# Patient Record
Sex: Male | Born: 1955 | Race: White | Hispanic: No | Smoking: Never smoker
Health system: Southern US, Community
[De-identification: ages and names within clinical notes are randomized; demographics above are authoritative.]

---

## 2021-04-13 ENCOUNTER — Emergency Department (HOSPITAL_BASED_OUTPATIENT_CLINIC_OR_DEPARTMENT_OTHER): Payer: BC Managed Care – PPO

## 2021-04-13 ENCOUNTER — Other Ambulatory Visit: Payer: Self-pay

## 2021-04-13 ENCOUNTER — Other Ambulatory Visit (HOSPITAL_BASED_OUTPATIENT_CLINIC_OR_DEPARTMENT_OTHER): Payer: Self-pay

## 2021-04-13 ENCOUNTER — Encounter (HOSPITAL_BASED_OUTPATIENT_CLINIC_OR_DEPARTMENT_OTHER): Payer: Self-pay

## 2021-04-13 ENCOUNTER — Emergency Department (HOSPITAL_BASED_OUTPATIENT_CLINIC_OR_DEPARTMENT_OTHER)
Admission: EM | Admit: 2021-04-13 | Discharge: 2021-04-13 | Disposition: A | Discharge status: Home / Self Care | Payer: BC Managed Care – PPO | Attending: Emergency Medicine | Admitting: Emergency Medicine

## 2021-04-13 DIAGNOSIS — E119 Type 2 diabetes mellitus without complications: Secondary | ICD-10-CM | POA: Diagnosis not present

## 2021-04-13 DIAGNOSIS — R350 Frequency of micturition: Secondary | ICD-10-CM | POA: Insufficient documentation

## 2021-04-13 DIAGNOSIS — R197 Diarrhea, unspecified: Secondary | ICD-10-CM | POA: Diagnosis not present

## 2021-04-13 DIAGNOSIS — Z20822 Contact with and (suspected) exposure to covid-19: Secondary | ICD-10-CM | POA: Insufficient documentation

## 2021-04-13 DIAGNOSIS — J189 Pneumonia, unspecified organism: Secondary | ICD-10-CM | POA: Diagnosis not present

## 2021-04-13 DIAGNOSIS — R0789 Other chest pain: Secondary | ICD-10-CM | POA: Diagnosis present

## 2021-04-13 LAB — CBC WITH DIFFERENTIAL/PLATELET
Abs Immature Granulocytes: 0.06 10*3/uL (ref 0.00–0.07)
Basophils Absolute: 0.1 10*3/uL (ref 0.0–0.1)
Basophils Relative: 0 %
Eosinophils Absolute: 0 10*3/uL (ref 0.0–0.5)
Eosinophils Relative: 0 %
HCT: 47.1 % (ref 39.0–52.0)
Hemoglobin: 15.8 g/dL (ref 13.0–17.0)
Immature Granulocytes: 0 %
Lymphocytes Relative: 15 %
Lymphs Abs: 2 10*3/uL (ref 0.7–4.0)
MCH: 30.3 pg (ref 26.0–34.0)
MCHC: 33.5 g/dL (ref 30.0–36.0)
MCV: 90.4 fL (ref 80.0–100.0)
Monocytes Absolute: 1.1 10*3/uL — ABNORMAL HIGH (ref 0.1–1.0)
Monocytes Relative: 9 %
Neutro Abs: 10.2 10*3/uL — ABNORMAL HIGH (ref 1.7–7.7)
Neutrophils Relative %: 76 %
Platelets: 233 10*3/uL (ref 150–400)
RBC: 5.21 MIL/uL (ref 4.22–5.81)
RDW: 13.8 % (ref 11.5–15.5)
WBC: 13.5 10*3/uL — ABNORMAL HIGH (ref 4.0–10.5)
nRBC: 0 % (ref 0.0–0.2)

## 2021-04-13 LAB — I-STAT VENOUS BLOOD GAS, ED
Acid-Base Excess: 4 mmol/L — ABNORMAL HIGH (ref 0.0–2.0)
Bicarbonate: 28.4 mmol/L — ABNORMAL HIGH (ref 20.0–28.0)
Calcium, Ion: 1.1 mmol/L — ABNORMAL LOW (ref 1.15–1.40)
HCT: 43 % (ref 39.0–52.0)
Hemoglobin: 14.6 g/dL (ref 13.0–17.0)
O2 Saturation: 47 %
Potassium: 4.8 mmol/L (ref 3.5–5.1)
Sodium: 136 mmol/L (ref 135–145)
TCO2: 30 mmol/L (ref 22–32)
pCO2, Ven: 40.3 mmHg — ABNORMAL LOW (ref 44.0–60.0)
pH, Ven: 7.455 — ABNORMAL HIGH (ref 7.250–7.430)
pO2, Ven: 25 mmHg — CL (ref 32.0–45.0)

## 2021-04-13 LAB — COMPREHENSIVE METABOLIC PANEL
ALT: 16 U/L (ref 0–44)
AST: 15 U/L (ref 15–41)
Albumin: 4.4 g/dL (ref 3.5–5.0)
Alkaline Phosphatase: 53 U/L (ref 38–126)
Anion gap: 12 (ref 5–15)
BUN: 13 mg/dL (ref 8–23)
CO2: 24 mmol/L (ref 22–32)
Calcium: 9.5 mg/dL (ref 8.9–10.3)
Chloride: 100 mmol/L (ref 98–111)
Creatinine, Ser: 0.85 mg/dL (ref 0.61–1.24)
GFR, Estimated: >60 mL/min (ref >60)
Glucose, Bld: 116 mg/dL — ABNORMAL HIGH (ref 70–99)
Potassium: 3.7 mmol/L (ref 3.5–5.1)
Sodium: 136 mmol/L (ref 135–145)
Total Bilirubin: 1.5 mg/dL — ABNORMAL HIGH (ref 0.3–1.2)
Total Protein: 7.4 g/dL (ref 6.5–8.1)

## 2021-04-13 LAB — URINALYSIS, ROUTINE W REFLEX MICROSCOPIC
Bilirubin Urine: NEGATIVE
Glucose, UA: >1000 mg/dL — AB
Hgb urine dipstick: NEGATIVE
Leukocytes,Ua: NEGATIVE
Nitrite: NEGATIVE
Protein, ur: NEGATIVE mg/dL
Specific Gravity, Urine: 1.038 — ABNORMAL HIGH (ref 1.005–1.030)
pH: 5 (ref 5.0–8.0)

## 2021-04-13 LAB — RESP PANEL BY RT-PCR (FLU A&B, COVID) ARPGX2
Influenza A by PCR: NEGATIVE
Influenza B by PCR: NEGATIVE
SARS Coronavirus 2 by RT PCR: NEGATIVE

## 2021-04-13 LAB — CBG MONITORING, ED: Glucose-Capillary: 126 mg/dL — ABNORMAL HIGH (ref 70–99)

## 2021-04-13 MED ORDER — IOHEXOL 300 MG/ML  SOLN
75.0000 mL | Freq: Once | INTRAMUSCULAR | Status: AC | PRN
  Administered 2021-04-13: 75 mL via INTRAVENOUS

## 2021-04-13 MED ORDER — AZITHROMYCIN 250 MG PO TABS
250.0000 mg | ORAL_TABLET | Freq: Every day | ORAL | 0 refills | Status: AC
  Filled 2021-04-13: qty 6, 5d supply, fill #0

## 2021-04-13 MED ORDER — AMOXICILLIN-POT CLAVULANATE 875-125 MG PO TABS
1.0000 | ORAL_TABLET | Freq: Two times a day (BID) | ORAL | 0 refills | Status: AC
  Filled 2021-04-13: qty 14, 7d supply, fill #0

## 2021-04-13 NOTE — ED Provider Notes (Signed)
[facility]   CSN: [id number] Arrival date & time: 04/13/21  [id number]     History Chief Complaint  Patient presents with   Fatigue   Urinary Frequency    Tyler CoveRoger Parker is a 65 y.o. male.  He has a history of diabetes.  He is complaining of a few days of feeling generally weak, and urinary frequency.  No dysuria or hematuria.  No chest pain or abdominal pain.  Has a little bit of a cough and had some loose stools.  No fevers or chills.  No sick contacts or recent travel.  He says he feels similar to the beginning of symptoms that led to him being in DKA a few years ago.  The history is provided by the patient.  Urinary Frequency This is a new problem. The current episode started more than 2 days ago. The problem occurs hourly. The problem has not changed since onset.Pertinent negatives include no chest pain, no abdominal pain, no headaches and no shortness of breath. Nothing aggravates the symptoms. Nothing relieves the symptoms. He has tried nothing for the symptoms. The treatment provided no relief.      History reviewed. No pertinent past medical history.  There are no problems to display for this patient.   History reviewed. No pertinent surgical history.     No family history on file.  Social History   Tobacco Use   Smoking status: Never   Smokeless tobacco: Never  Substance Use Topics   Alcohol use: Yes    Comment: Occasionally   Drug use: Never    Home Medications Prior to Admission medications   Not on File    Allergies    Patient has no allergy information on record.  Review of Systems   Review of Systems  Constitutional:  Positive for fatigue. Negative for fever.  HENT:  Negative for sore throat.   Eyes:  Negative for visual disturbance.  Respiratory:  Positive for cough. Negative for shortness of breath.   Cardiovascular:  Negative for chest pain.  Gastrointestinal:  Positive for diarrhea. Negative for  abdominal pain.  Genitourinary:  Positive for frequency. Negative for dysuria.  Musculoskeletal:  Negative for gait problem.  Skin:  Negative for rash.  Neurological:  Negative for headaches.   Physical Exam Updated Vital Signs BP (!) 141/77 (BP Location: Right Arm)   Pulse 84   Temp 98.4 F (36.9 C) (Oral)   Resp 16   Ht 6\' 3"  (1.905 m)   Wt 102.1 kg   SpO2 100%   BMI 28.12 kg/m   Physical Exam Vitals and nursing note reviewed.  Constitutional:      Appearance: Normal appearance. He is well-developed.  HENT:     Head: Normocephalic and atraumatic.  Eyes:     Conjunctiva/sclera: Conjunctivae normal.  Cardiovascular:     Rate and Rhythm: Normal rate and regular rhythm.     Heart sounds: No murmur heard. Pulmonary:     Effort: Pulmonary effort is normal. No respiratory distress.     Breath sounds: Normal breath sounds.  Abdominal:     Palpations: Abdomen is soft.     Tenderness: There is no abdominal tenderness. There is no guarding or rebound.  Musculoskeletal:        General: No deformity or signs of injury. Normal range of motion.     Cervical back: Neck supple.  Skin:    General: Skin is warm and dry.  Neurological:     General:  No focal deficit present.     Mental Status: He is alert.     Sensory: No sensory deficit.     Motor: No weakness.     Gait: Gait normal.    ED Results / Procedures / Treatments   Labs (all labs ordered are listed, but only abnormal results are displayed) Labs Reviewed  COMPREHENSIVE METABOLIC PANEL - Abnormal; Notable for the following components:      Result Value   Glucose, Bld 116 (*)    Total Bilirubin 1.5 (*)    All other components within normal limits  CBC WITH DIFFERENTIAL/PLATELET - Abnormal; Notable for the following components:   WBC 13.5 (*)    Neutro Abs 10.2 (*)    Monocytes Absolute 1.1 (*)    All other components within normal limits  URINALYSIS, ROUTINE W REFLEX MICROSCOPIC - Abnormal; Notable for the  following components:   Specific Gravity, Urine 1.038 (*)    Glucose, UA >1,000 (*)    Ketones, ur TRACE (*)    All other components within normal limits  CBG MONITORING, ED - Abnormal; Notable for the following components:   Glucose-Capillary 126 (*)    All other components within normal limits  I-STAT VENOUS BLOOD GAS, ED - Abnormal; Notable for the following components:   pH, Ven 7.455 (*)    pCO2, Ven 40.3 (*)    pO2, Ven 25.0 (*)    Bicarbonate 28.4 (*)    Acid-Base Excess 4.0 (*)    Calcium, Ion 1.10 (*)    All other components within normal limits  RESP PANEL BY RT-PCR (FLU A&B, COVID) [id number]  URINE CULTURE    EKG EKG Interpretation  Date/Time:  Wednesday April 13 2021 07:14:14 EDT Ventricular Rate:  79 PR Interval:  174 QRS Duration: 98 QT Interval:  368 QTC Calculation: 422 R Axis:   72 Text Interpretation: Sinus rhythm No old tracing to compare Confirmed by Meridee Score [phone] on 04/13/2021 7:17:57 AM  Radiology CT Chest W Contrast  Result Date: 04/13/2021 CLINICAL DATA:  Follow-up chest x-ray EXAM: CT CHEST WITH CONTRAST TECHNIQUE: Multidetector CT imaging of the chest was performed during intravenous contrast administration. CONTRAST:  28mL OMNIPAQUE IOHEXOL 300 MG/ML  SOLN COMPARISON:  Chest x-ray earlier today FINDINGS: Cardiovascular: Heart is normal size. Aorta is normal caliber. Scattered moderate coronary artery calcifications most pronounced in the left anterior descending coronary artery and right coronary artery. Scattered aortic calcifications. Mediastinum/Nodes: No mediastinal, hilar, or axillary adenopathy. Trachea and esophagus are unremarkable. Thyroid unremarkable. Lungs/Pleura: Airspace disease in the lingula corresponds to the density seen on chest x-ray and most likely reflects pneumonia. Remainder the lungs are clear. No effusions. Upper Abdomen: Imaging into the upper abdomen demonstrates no acute findings. Musculoskeletal: Chest wall soft tissues  are unremarkable. No acute bony abnormality. IMPRESSION: Lingular airspace opacity most compatible with pneumonia. Followup PA and lateral chest X-ray is recommended in 3-4 weeks following trial of antibiotic therapy to ensure resolution and exclude underlying malignancy. Coronary artery disease. Aortic Atherosclerosis (ICD10-I70.0). Electronically Signed   By: Charlett Nose M.D.   On: 04/13/2021 08:54   DG Chest Port 1 View  Result Date: 04/13/2021 CLINICAL DATA:  65 year old male with weakness, cough, fatigue, urinary frequency. EXAM: PORTABLE CHEST 1 VIEW COMPARISON:  None. FINDINGS: Portable AP upright view at 0733 hours. Normal lung volumes and mediastinal contours. Visualized tracheal air column is within normal limits. Allowing for portable technique the right lung is clear. There is vague increased opacity at the  lateral left lung base, sparing the costophrenic angle. No air bronchograms. No pneumothorax or edema. No evidence of pleural effusion. No acute osseous abnormality identified. Paucity of bowel gas in the upper abdomen. IMPRESSION: Vague asymmetric increased opacity at the left lung base sparing the costophrenic angle. This is nonspecific but consider pneumonia in this setting. If the patient has signs/symptoms of infection then followup PA and lateral chest X-ray is recommended in 3-4 weeks following trial of antibiotic therapy to ensure resolution and exclude underlying malignancy. Otherwise recommend Chest CT (IV contrast preferred) to further characterize. Electronically Signed   By: Odessa Fleming M.D.   On: 04/13/2021 07:58    Procedures Procedures   Medications Ordered in ED Medications  iohexol (OMNIPAQUE) 300 MG/ML solution 75 mL (75 mLs Intravenous Contrast Given 04/13/21 0831)    ED Course  I have reviewed the triage vital signs and the nursing notes.  Pertinent labs & imaging results that were available during my care of the patient were reviewed by me and considered in my  medical decision making (see chart for details).  Clinical Course as of 04/13/21 1807  Wed Apr 13, 2021  0737 Chest x-ray interpreted by me as possible left lower infiltrate.  Radiology recommending CT chest with contrast to further clarify. [MB]  0900 CT being read as probable lingular pneumonia. [MB]    Clinical Course User Index [MB] Terrilee Files, MD   MDM Rules/Calculators/A&P                         Tyler Parker was evaluated in Emergency Department on 04/13/2021 for the symptoms described in the history of present illness. He was evaluated in the context of the global COVID-19 pandemic, which necessitated consideration that the patient might be at risk for infection with the SARS-CoV-2 virus that causes COVID-19. Institutional protocols and algorithms that pertain to the evaluation of patients at risk for COVID-19 are in a state of rapid change based on information released by regulatory bodies including the CDC and federal and state organizations. These policies and algorithms were followed during the patient's care in the ED.  This patient complains of urinary frequency weakness cough; this involves an extensive number of treatment Options and is a complaint that carries with it a high risk of complications and Morbidity. The differential includes COVID, flu, UTI, dehydration, metabolic derangement, pneumonia  I ordered, reviewed and interpreted labs, which included CBC with elevated white count, normal hemoglobin, chemistries fairly normal, LFTs fairly normal, VBG not acidotic, COVID and flu testing negative  I ordered imaging studies which included chest x-ray and CT chest with IV contrast and I independently    visualized and interpreted imaging which showed probable lingular pneumonia cannot exclude malignancy  Previous records obtained and reviewed in epic, none available  After the interventions stated above, I reevaluated the patient and found patient otherwise  nontoxic-appearing and satting well on room air.  No indications for admission.  Will cover with antibiotics and recommended establishing primary care and follow-up with urology.  Return instructions discussed   Final Clinical Impression(s) / ED Diagnoses Final diagnoses:  Lingular pneumonia  Urinary frequency    Rx / DC Orders ED Discharge Orders          Ordered    amoxicillin-clavulanate (AUGMENTIN) 875-125 MG tablet  Every 12 hours        04/13/21 0927    azithromycin (ZITHROMAX) 250 MG tablet  Daily  04/13/21 5681             Terrilee Files, MD 04/13/21 [phone]

## 2021-04-13 NOTE — ED Triage Notes (Signed)
Patient here POV from Home with Fatigue and Urinary Frequency.  Patient states he has been having these symptoms for the past few days.   No Fevers at Home. No CP. No SOB. Hx T2DM.   Ambulatory, GCS 15. NAD noted during Triage.

## 2021-04-13 NOTE — Discharge Instructions (Addendum)
You were seen in the emergency department for urinary frequency cough and not feeling well.  You had lab work EKG chest x-ray and a CAT scan of your chest.  The main significant findings were signs of a pneumonia on the left side.  We are prescribing you 2 antibiotics.  Please drink plenty of fluids Tylenol as needed for pain.  Follow-up x-ray will be needed to make sure that this resolves.  We are also giving a number for urology if your urinary frequency continues.

## 2021-04-13 NOTE — ED Notes (Signed)
Patient states he had a similar episode 8 years ago in which he was in DKA. No Increase in Thirst and no recent increase in CBG results.

## 2021-04-14 LAB — URINE CULTURE: Culture: <10000 — AB

## 2022-07-13 IMAGING — DX DG CHEST 1V PORT
1 series · 1 of 1 positions shown · non-contrast
Comparison: None.

CLINICAL DATA: 65-year-old male with weakness, cough, fatigue,
urinary frequency.

EXAM:
PORTABLE CHEST 1 VIEW

[chest]
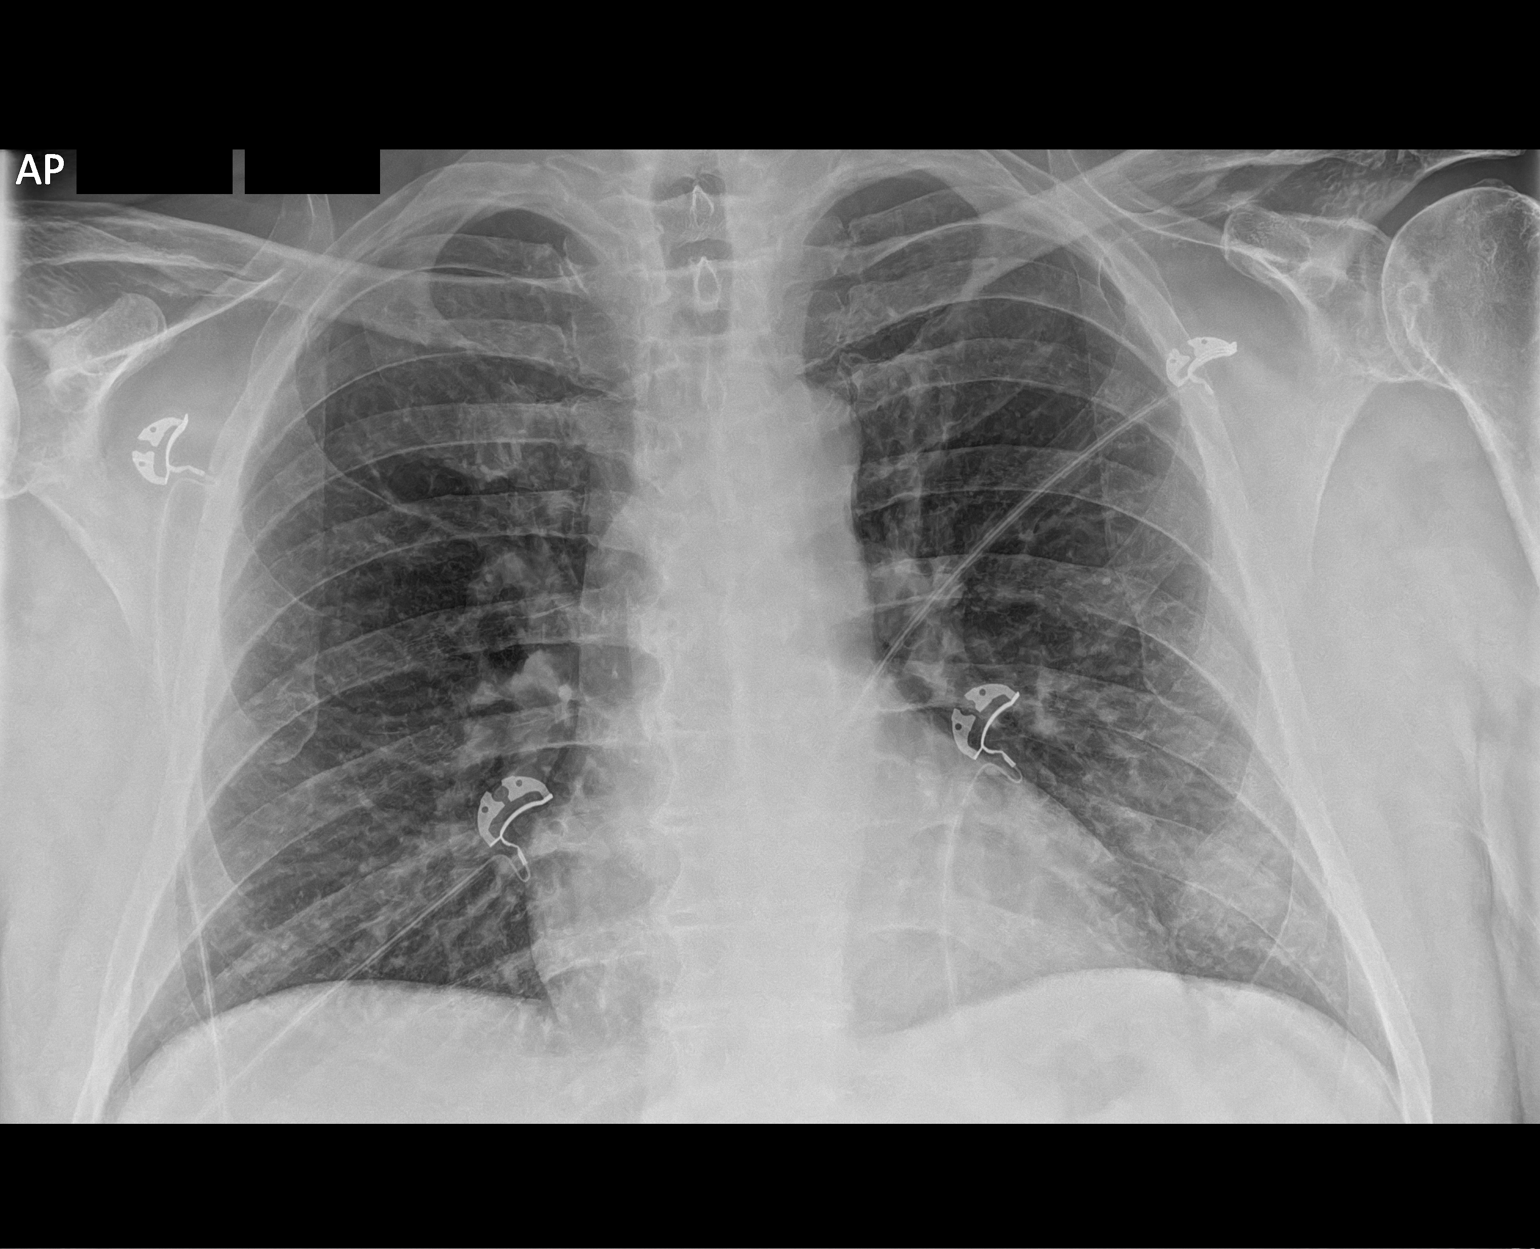

[1 of 1 positions shown; findings below may reference images not displayed]

FINDINGS: Portable AP upright view at 0288 hours. Normal lung volumes and
mediastinal contours. Visualized tracheal air column is within
normal limits. Allowing for portable technique the right lung is
clear. There is vague increased opacity at the lateral left lung
base, sparing the costophrenic angle. No air bronchograms. No
pneumothorax or edema. No evidence of pleural effusion. No acute
osseous abnormality identified. Paucity of bowel gas in the upper
abdomen.
IMPRESSION: Vague asymmetric increased opacity at the left lung base sparing the
costophrenic angle. This is nonspecific but consider pneumonia in
this setting.

If the patient has signs/symptoms of infection then followup PA and
lateral chest X-ray is recommended in 3-4 weeks following trial of
antibiotic therapy to ensure resolution and exclude underlying
malignancy.

Otherwise recommend Chest CT (IV contrast preferred) to further
characterize.

## 2022-07-13 IMAGING — CT CT CHEST W/ CM
2 of 4 series · 15 of 36 positions shown, 18 images · IV contrast (omnipaque)
Comparison: Chest x-ray earlier today

CLINICAL DATA: Follow-up chest x-ray

EXAM:
CT CHEST WITH CONTRAST
TECHNIQUE: Multidetector CT imaging of the chest was performed during
intravenous contrast administration.
CONTRAST:  75mL OMNIPAQUE IOHEXOL 300 MG/ML  SOLN

[Series 2: routine chest with · axial · 0.82mm/px · z∈[+1233,+1509]mm · 12 of 164 slices shown, 15 images]
[im 13/164  mediastinal]
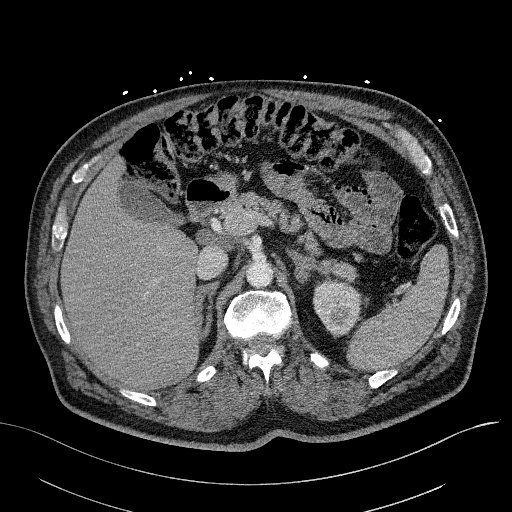
[im 13/164  lung]
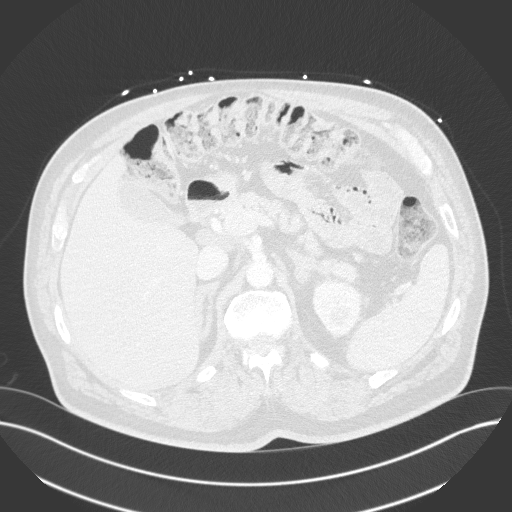
[im 26/164  lung]
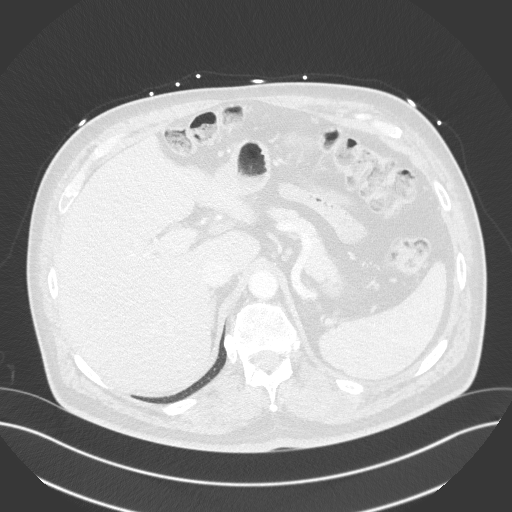
[im 38/164  lung]
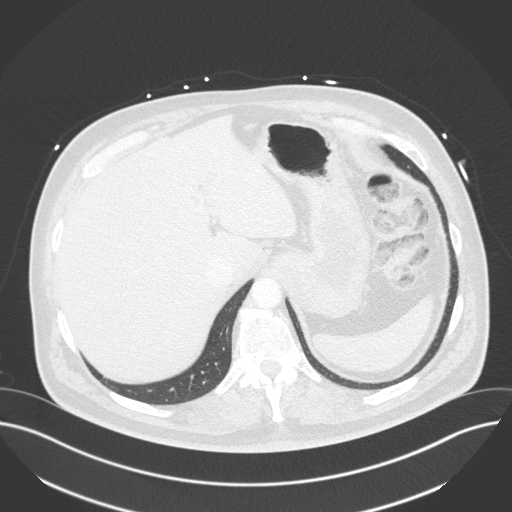
[im 51/164  lung]
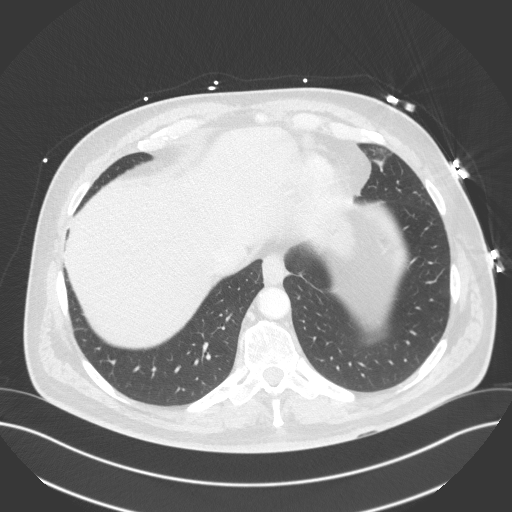
[im 63/164  mediastinal]
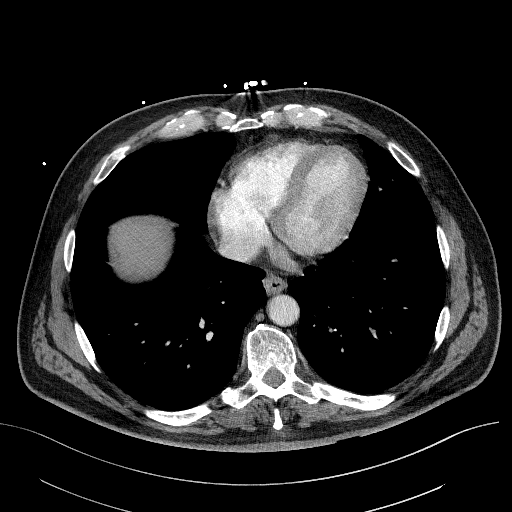
[im 63/164  lung]
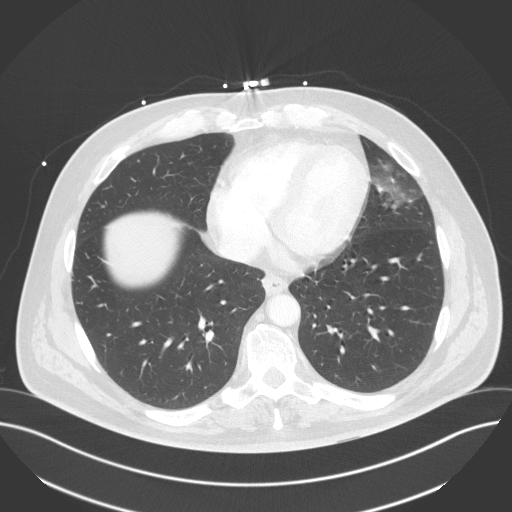
[im 76/164  lung]
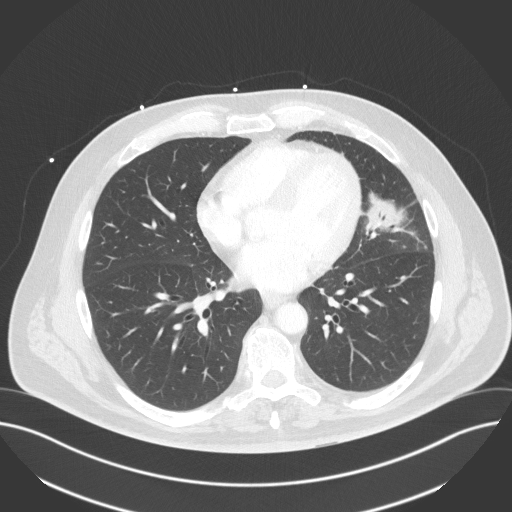
[im 88/164  lung]
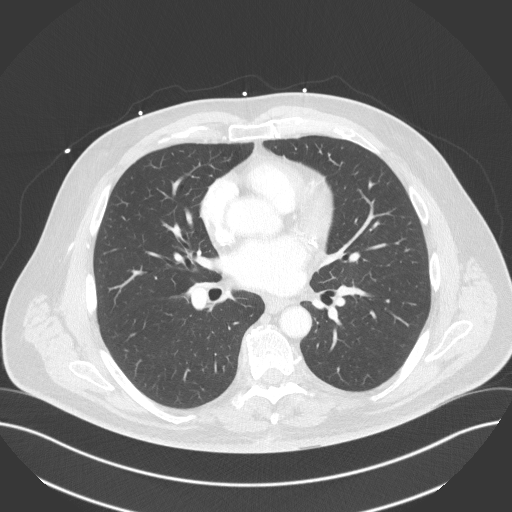
[im 101/164  lung]
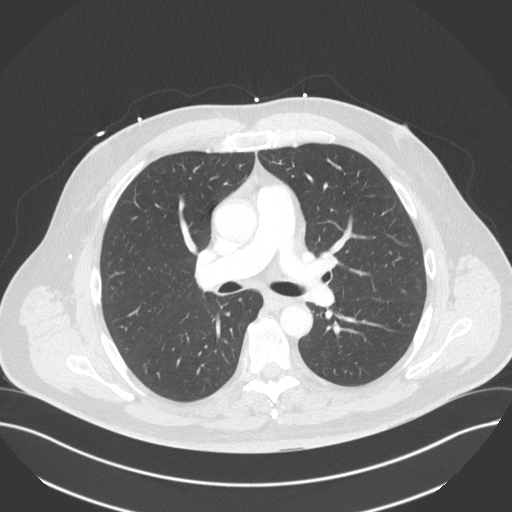
[im 113/164  mediastinal]
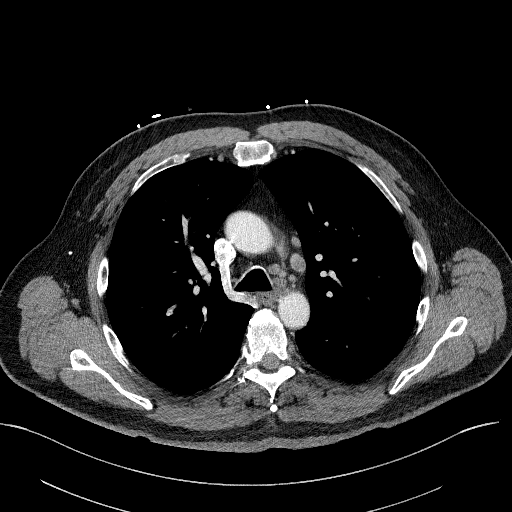
[im 113/164  lung]
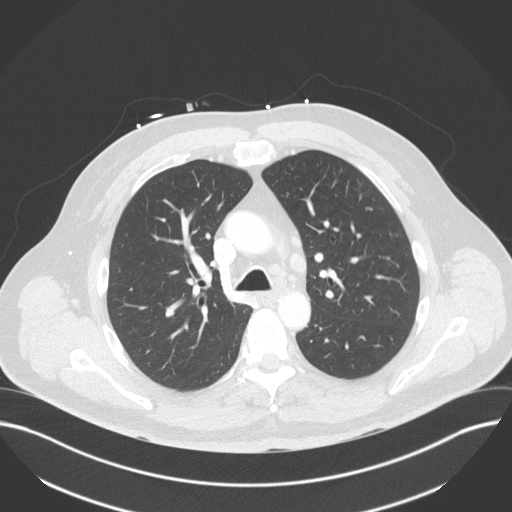
[im 126/164  lung]
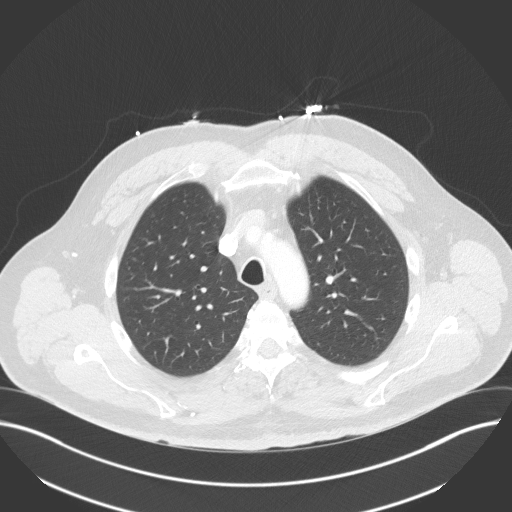
[im 138/164  lung]
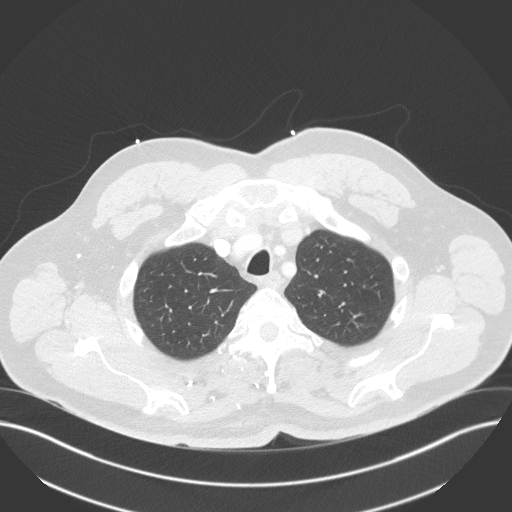
[im 151/164  lung]
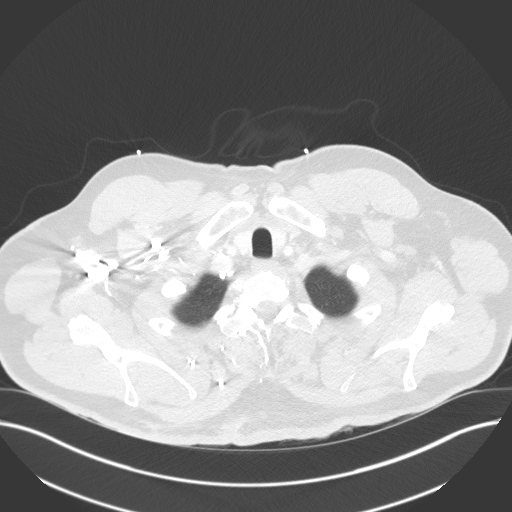

[Series 5: coronal · coronal · 0.65mm/px · 3 of 163 slices shown]
[im 33/163  lung]
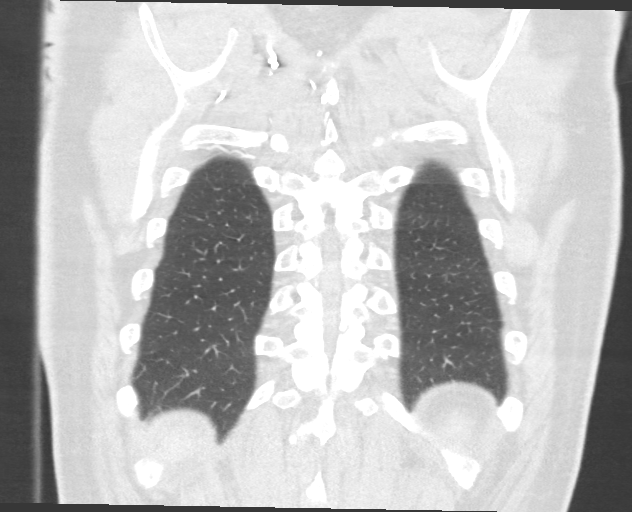
[im 65/163  lung]
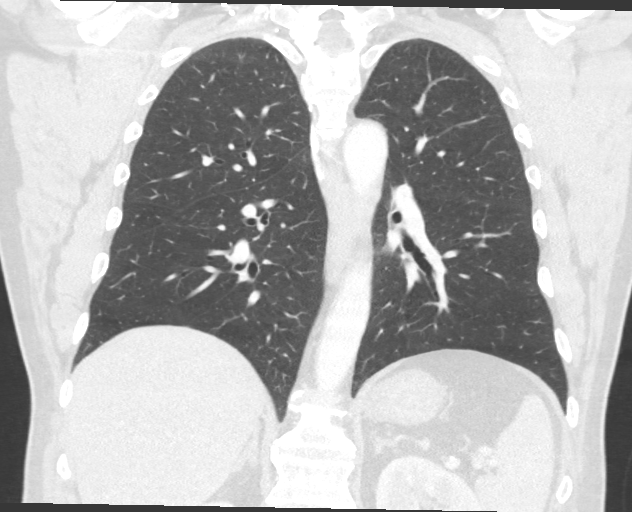
[im 98/163  lung]
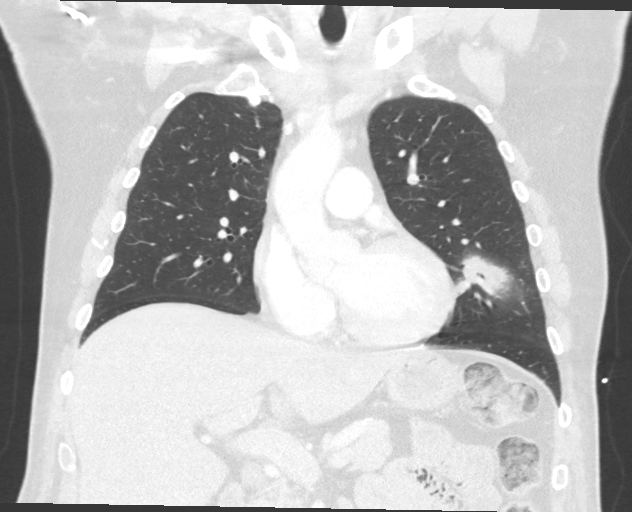

[15 of 36 positions shown; findings below may reference images not displayed]

FINDINGS: Cardiovascular: Heart is normal size. Aorta is normal caliber.
Scattered moderate coronary artery calcifications most pronounced in
the left anterior descending coronary artery and right coronary
artery. Scattered aortic calcifications.

Mediastinum/Nodes: No mediastinal, hilar, or axillary adenopathy.
Trachea and esophagus are unremarkable. Thyroid unremarkable.

Lungs/Pleura: Airspace disease in the lingula corresponds to the
density seen on chest x-ray and most likely reflects pneumonia.
Remainder the lungs are clear. No effusions.

Upper Abdomen: Imaging into the upper abdomen demonstrates no acute
findings.

Musculoskeletal: Chest wall soft tissues are unremarkable. No acute
bony abnormality.
IMPRESSION: Lingular airspace opacity most compatible with pneumonia. Followup
PA and lateral chest X-ray is recommended in 3-4 weeks following
trial of antibiotic therapy to ensure resolution and exclude
underlying malignancy.

Coronary artery disease.

Aortic Atherosclerosis ([id number]).
